# Patient Record
Sex: Male | Born: 1980 | Race: White | Hispanic: No | Marital: Single | Smoking: Never smoker
Health system: Southern US, Community
[De-identification: ages and names within clinical notes are randomized; demographics above are authoritative.]

---

## 2008-09-09 ENCOUNTER — Emergency Department (HOSPITAL_BASED_OUTPATIENT_CLINIC_OR_DEPARTMENT_OTHER): Admission: EM | Admit: 2008-09-09 | Discharge: 2008-09-09 | Payer: Self-pay | Admitting: Emergency Medicine

## 2008-09-27 ENCOUNTER — Emergency Department (HOSPITAL_BASED_OUTPATIENT_CLINIC_OR_DEPARTMENT_OTHER): Admission: EM | Admit: 2008-09-27 | Discharge: 2008-09-27 | Payer: Self-pay | Admitting: Emergency Medicine

## 2008-09-29 ENCOUNTER — Ambulatory Visit (HOSPITAL_COMMUNITY): Admission: RE | Admit: 2008-09-29 | Discharge: 2008-09-29 | Payer: Self-pay | Admitting: Family Medicine

## 2008-11-02 ENCOUNTER — Ambulatory Visit (HOSPITAL_COMMUNITY): Admission: RE | Admit: 2008-11-02 | Discharge: 2008-11-03 | Payer: Self-pay | Admitting: Neurosurgery

## 2010-09-04 ENCOUNTER — Encounter: Payer: Self-pay | Admitting: Family Medicine

## 2010-11-24 LAB — DIFFERENTIAL
Basophils Relative: 1 % (ref 0–1)
Eosinophils Absolute: 0.3 10*3/uL (ref 0.0–0.7)
Eosinophils Relative: 4 % (ref 0–5)
Monocytes Absolute: 0.7 10*3/uL (ref 0.1–1.0)
Monocytes Relative: 10 % (ref 3–12)

## 2010-11-24 LAB — CBC
HCT: 43 % (ref 39.0–52.0)
Hemoglobin: 15.1 g/dL (ref 13.0–17.0)
MCHC: 35.2 g/dL (ref 30.0–36.0)
MCV: 90.5 fL (ref 78.0–100.0)
RBC: 4.75 MIL/uL (ref 4.22–5.81)
RDW: 12 % (ref 11.5–15.5)

## 2010-11-24 LAB — TYPE AND SCREEN

## 2010-11-28 LAB — COMPREHENSIVE METABOLIC PANEL
AST: 31 U/L (ref 0–37)
CO2: 28 mEq/L (ref 19–32)
Chloride: 104 mEq/L (ref 96–112)
Creatinine, Ser: 1.2 mg/dL (ref 0.4–1.5)
GFR calc Af Amer: >60 mL/min (ref >60)
GFR calc non Af Amer: >60 mL/min (ref >60)
Glucose, Bld: 89 mg/dL (ref 70–99)
Total Bilirubin: 0.5 mg/dL (ref 0.3–1.2)

## 2010-11-28 LAB — DIFFERENTIAL
Basophils Absolute: 0.1 10*3/uL (ref 0.0–0.1)
Eosinophils Absolute: 0.2 10*3/uL (ref 0.0–0.7)
Eosinophils Relative: 2 % (ref 0–5)
Lymphocytes Relative: 24 % (ref 12–46)

## 2010-11-28 LAB — CBC
HCT: 48.1 % (ref 39.0–52.0)
Hemoglobin: 16.3 g/dL (ref 13.0–17.0)
MCV: 91.6 fL (ref 78.0–100.0)
RBC: 5.25 MIL/uL (ref 4.22–5.81)
WBC: 7.9 10*3/uL (ref 4.0–10.5)

## 2010-12-27 NOTE — Op Note (Signed)
NAMEALCUS, BRADLY               ACCOUNT NO.:  [id number]   MEDICAL RECORD NO.:  [id number]          PATIENT TYPE:  OIB   LOCATION:  3528                         FACILITY:  [facility]   PHYSICIAN:  Henry A. Pool, M.D.    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   PROCEDURE:  Right L5/transitional level laminotomy and microdiskectomy.   SURGEON:  Kathaleen Maser. Pool, MD   ASSISTANT:  Donalee Citrin, MD   ANESTHESIA:  General endotracheal.   INDICATIONS:  Mr. Curtis Molina is a 30 year old male with history of back and  right lower extremity pain failing conservative management.  Workup  demonstrates evidence of a enlarged right paracentral disk herniation in  his lumbosacral junction.  The patient has evidence for what can best be  described as transitional anatomy.  The disk spaces at C-L5 slash  transitional level paracentrally on the right.  We have discussed  options available for management including the possibility of moving  forward with a laminotomy and microdiskectomy and hope curing his  symptoms.   OPERATIVE PROC DURE:  The patient was brought to operating room, placed  on the operating table the supine position.  After an adequate level of  anesthesia was achieved, the patient was placed prone onto Wilson frame.  Appropriately padded the patient's lumbar region, prepped and sterilely.  A 10 blade was used to make a curvilinear skin incision, overlying his  lumbosacral junction.  This was carried down sharply in midline.  Subperiosteal dissection was then performed exposing the lamina and  facet joints L5 and transitional level on the right side.  Deep self-  retaining retractor was placed.  Intraoperative x-rays  were taken and  level was confirmed.  Laminotomy was then performed using high-speed  drill and Kerrison rongeurs to remove the inferior aspect of the lamina  of L5, medial aspect of L5, transitional level, facet joint complex, and  the superior aspect of the transitional level lamina.  Ligament flavum  was then elevated and resected in piecemeal fashion using Kerrison  rongeurs.  Underlying thecal sac and exiting S1 nerve root were  identified.  Microscope was then brought to the field using  microdissection of the right side S1 nerve root underlying disk  herniation.  Epidural venous plexus coagulating cut.  Thecal sac and  nerve root were then gently mobilized, tracked towards midline.  Disk  herniation was readily apparent.  This was then incised with 15 blade in  a rectangular fashion.  Wide disk space clean-out was achieved using  pituitary rongeurs, upbiting pituitary rongeurs, and Epstein curettes.  Almost the disk herniation completely resected.  All loose or obviously  degenerative disk material was then removed the interspace.  At this  point, a very thorough diskectomy was achieved.  There is no evidence of  injury to thecal sac and nerve roots.  Wound was then  irrigated with  antibiotic solution.  Gelfoam was placed topically.  Hemostasis was  found to be good.  Microscope and Kerrisons  were removed.  Hemostasis once assured, the surgical wound was then  closed in layers with Vicryl sutures.  Steri-Strips, sterile dressings  were applied.  There are no complications.  He tolerated the procedure  well and he returns to recovery room postoperatively.           ______________________________  Kathaleen Maser Pool, M.D.     HAP/MEDQ  D:  11/02/2008  T:  11/03/2008  Job:  [id number]

## 2019-01-21 ENCOUNTER — Ambulatory Visit: Payer: Self-pay | Admitting: Registered Nurse

## 2019-04-25 ENCOUNTER — Encounter (HOSPITAL_COMMUNITY): Payer: Self-pay | Admitting: Emergency Medicine

## 2019-04-25 ENCOUNTER — Emergency Department (HOSPITAL_COMMUNITY)
Admission: EM | Admit: 2019-04-25 | Discharge: 2019-04-25 | Disposition: A | Discharge status: Home / Self Care | Payer: Self-pay | Attending: Emergency Medicine | Admitting: Emergency Medicine

## 2019-04-25 ENCOUNTER — Emergency Department (HOSPITAL_COMMUNITY): Payer: Self-pay

## 2019-04-25 ENCOUNTER — Other Ambulatory Visit: Payer: Self-pay

## 2019-04-25 DIAGNOSIS — M25561 Pain in right knee: Secondary | ICD-10-CM | POA: Insufficient documentation

## 2019-04-25 MED ORDER — KETOROLAC TROMETHAMINE 30 MG/ML IJ SOLN
30.0000 mg | Freq: Once | INTRAMUSCULAR | Status: DC
  Filled 2019-04-25: qty 1

## 2019-04-25 MED ORDER — NAPROXEN 375 MG PO TABS: 375.0000 mg | ORAL_TABLET | Freq: Two times a day (BID) | ORAL | 0 refills | Status: AC

## 2019-04-25 NOTE — Progress Notes (Signed)
Orthopedic Tech Progress Note Patient Details:  Curtis Molina 1980-12-06 [id number]  Ortho Devices Type of Ortho Device: Crutches, Knee Immobilizer Ortho Device/Splint Location: LRE Ortho Device/Splint Interventions: Adjustment, Application, Ordered   Post Interventions Patient Tolerated: Ambulated well Instructions Provided: Poper ambulation with device, Care of device, Adjustment of device   Janit Pagan 04/25/2019, 1:42 PM

## 2019-04-25 NOTE — Discharge Instructions (Addendum)
Call Dr. Luanna Cole office for an appointment.

## 2019-04-25 NOTE — ED Notes (Signed)
Ortho tech at pt bedside 

## 2019-04-25 NOTE — ED Triage Notes (Signed)
Rt knee pain sincve Monday and it hurts to walk and move , denies any I recent njury , hurt it 2 years ago

## 2019-04-25 NOTE — ED Notes (Signed)
Patient verbalizes understanding of discharge instructions. Opportunity for questioning and answers were provided. Armband removed by staff, pt discharged from ED.  

## 2019-04-25 NOTE — ED Provider Notes (Signed)
[facility]   CSN: [id number] Arrival date & time: 04/25/19  1054     History   Chief Complaint Chief Complaint  Patient presents with  . Knee Pain    HPI Gloriajean DellLucas H Stonehouse is a 38 y.o. male who presents to the ED with c/o right knee pain. Patient reports hx of injury to same knee a few years ago and had a torn meniscus. Patient does a lot of walking at work. This pain started about a week ago. Patent reports took ibuprofen at first but now not taking anything. Patient wearing an elastic knee brace. Patient denies any other symptoms.    The history is provided by the patient.  Knee Pain Location:  Knee Time since incident:  1 week Knee location:  R knee Pain details:    Quality:  Throbbing and shooting   Radiates to:  Does not radiate   Severity:  Severe   Onset quality:  Gradual   Timing:  Constant   Progression:  Worsening Chronicity:  Recurrent Dislocation: no   Foreign body present:  No foreign bodies Prior injury to area:  Yes Associated symptoms: decreased ROM (due to pain)     History reviewed. No pertinent past medical history.  There are no active problems to display for this patient.   History reviewed. No pertinent surgical history.      Home Medications    Prior to Admission medications   Medication Sig Start Date End Date Taking? Authorizing Provider  naproxen (NAPROSYN) 375 MG tablet Take 1 tablet (375 mg total) by mouth 2 (two) times daily. 04/25/19   Janne NapoleonNeese, Hope M, NP    Family History No family history on file.  Social History Social History   Tobacco Use  . Smoking status: Never Smoker  . Smokeless tobacco: Never Used  Substance Use Topics  . Alcohol use: Not on file  . Drug use: Not on file     Allergies   Patient has no known allergies.   Review of Systems Review of Systems  Constitutional: Negative for appetite change.  HENT: Negative.   Musculoskeletal: Positive for  arthralgias.  Skin: Negative for rash and wound.     Physical Exam Updated Vital Signs BP 93/79 (BP Location: Left Arm)   Pulse 74   Temp 98.5 F (36.9 C) (Oral)   Resp 17   SpO2 97%   Physical Exam Vitals signs and nursing note reviewed.  Constitutional:      Appearance: He is well-developed.  HENT:     Nose: No congestion.  Eyes:     Conjunctiva/sclera: Conjunctivae normal.  Neck:     Musculoskeletal: Neck supple.  Cardiovascular:     Rate and Rhythm: Normal rate.  Pulmonary:     Effort: Pulmonary effort is normal.  Musculoskeletal:     Right knee: He exhibits deformity. He exhibits normal range of motion, no swelling, no effusion, no ecchymosis, no laceration, no erythema, normal alignment and normal patellar mobility. Tenderness found. MCL and patellar tendon tenderness noted.     Comments: Pedal pulses 2+, adequate circulation. No calf tenderness.  Skin:    General: Skin is warm and dry.  Neurological:     Mental Status: He is alert and oriented to person, place, and time.  Psychiatric:        Mood and Affect: Mood normal.      ED Treatments / Results  Labs (all labs ordered are listed, but only abnormal  results are displayed) Labs Reviewed - No data to display  EKG None  Radiology Dg Knee Complete 4 Views Right  Result Date: 04/25/2019 CLINICAL DATA:  Pain when moving since Monday.  No recent trauma. EXAM: RIGHT KNEE - COMPLETE 4+ VIEW COMPARISON:  None. FINDINGS: Mild clothing artifact degradation. No acute fracture or dislocation. No joint effusion. Joint spaces maintained. IMPRESSION: No acute osseous abnormality. Electronically Signed   By: Abigail Miyamoto M.D.   On: 04/25/2019 12:37    Procedures Procedures (including critical care time)  Medications Ordered in ED Medications  ketorolac (TORADOL) 30 MG/ML injection 30 mg (has no administration in time range)     Initial Impression / Assessment and Plan / ED Course  I have reviewed the triage  vital signs and the nursing notes. 38 y.o. male with recurrent right knee pain stable for d/c without fracture or dislocation noted on x-ray. Referral to ortho. Discussed plan and answered patient questions. Knee immobilizer and crutches.  Pertinent labs & imaging results that were available during my care of the patient were reviewed by me and considered in my medical decision making (see chart for details).   Final Clinical Impressions(s) / ED Diagnoses   Final diagnoses:  Right knee pain, unspecified chronicity    ED Discharge Orders         Ordered    naproxen (NAPROSYN) 375 MG tablet  2 times daily     04/25/19 1315           Janit Bern [place], [place] 04/25/19 1318    Charlesetta Shanks, MD 04/28/19 1410

## 2020-05-08 IMAGING — CR DG KNEE COMPLETE 4+V*R*
4 series · 4 of 4 positions shown · non-contrast
Comparison: None.

CLINICAL DATA: Pain when moving since [REDACTED].  No recent trauma.

EXAM:
RIGHT KNEE - COMPLETE 4+ VIEW

[knee ap]
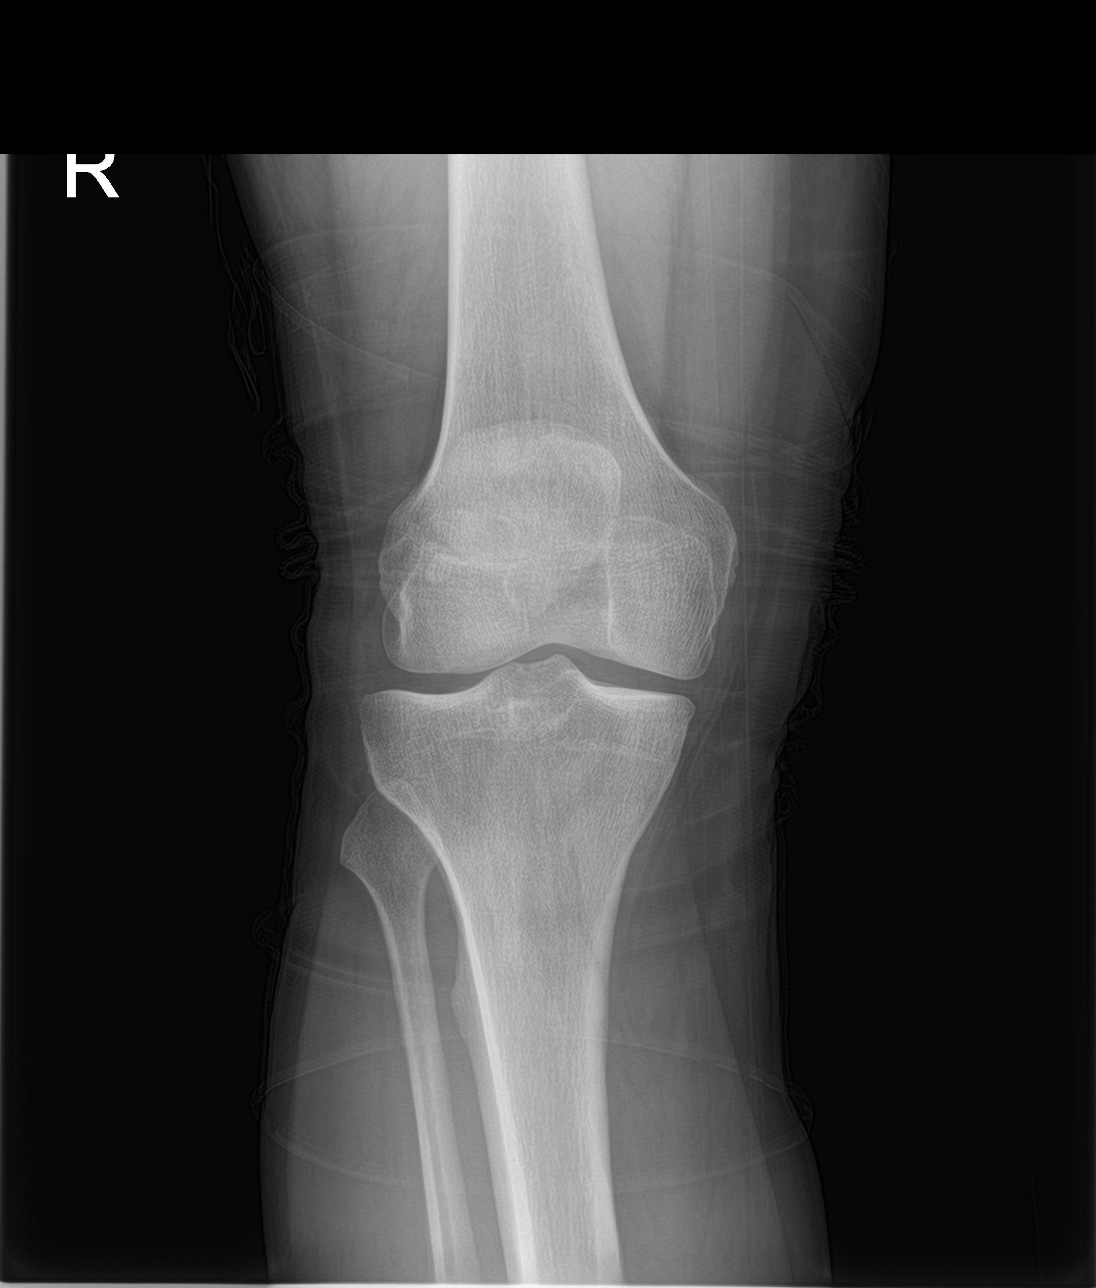

[knee lat]
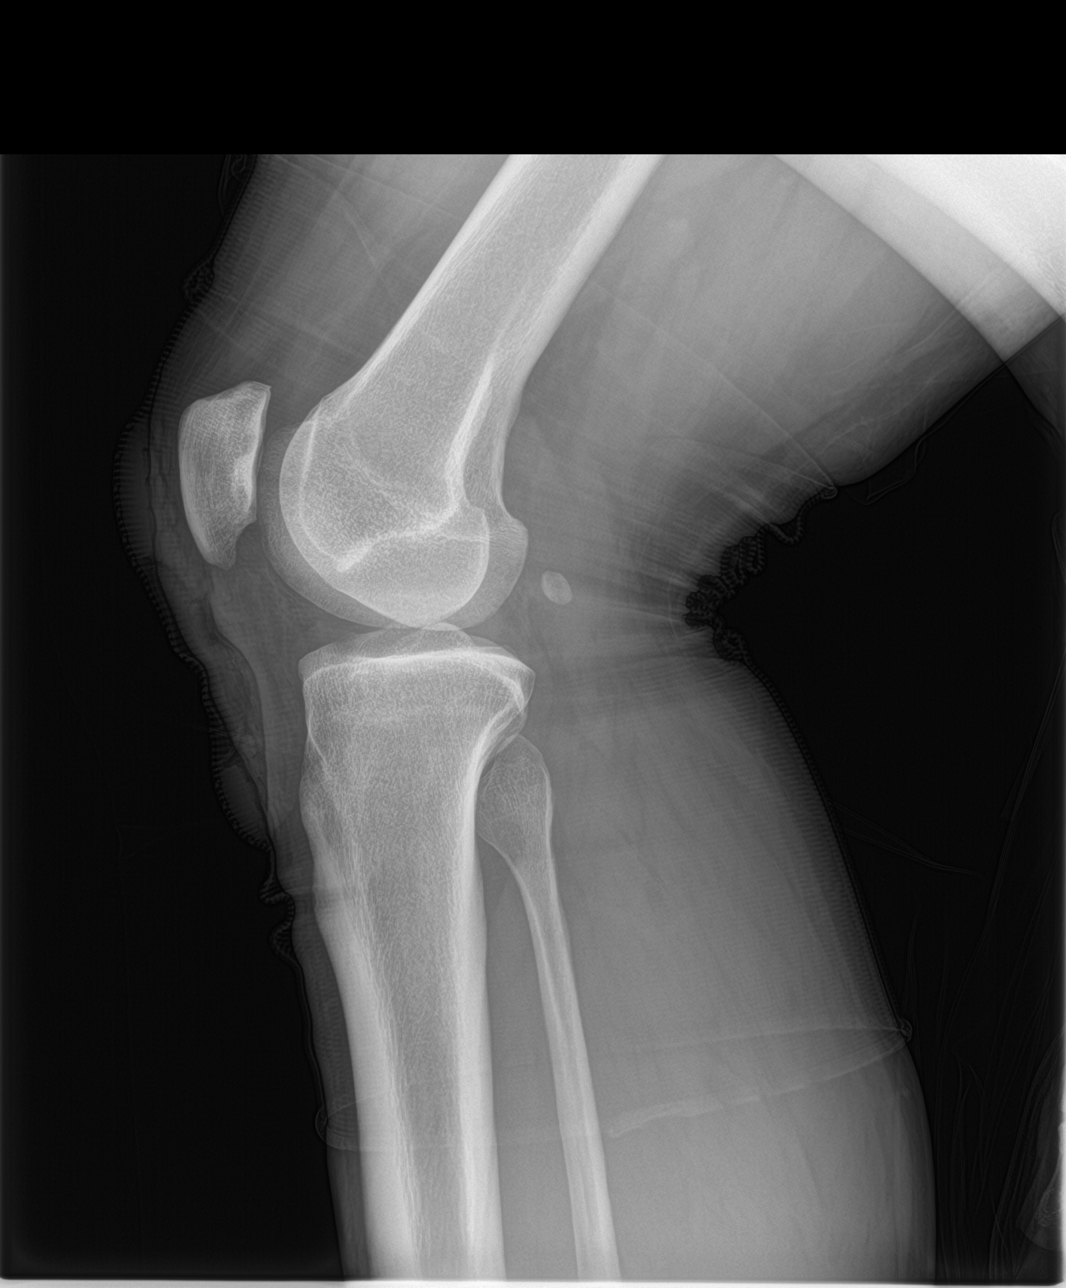

[knee obl (1 of 2)]
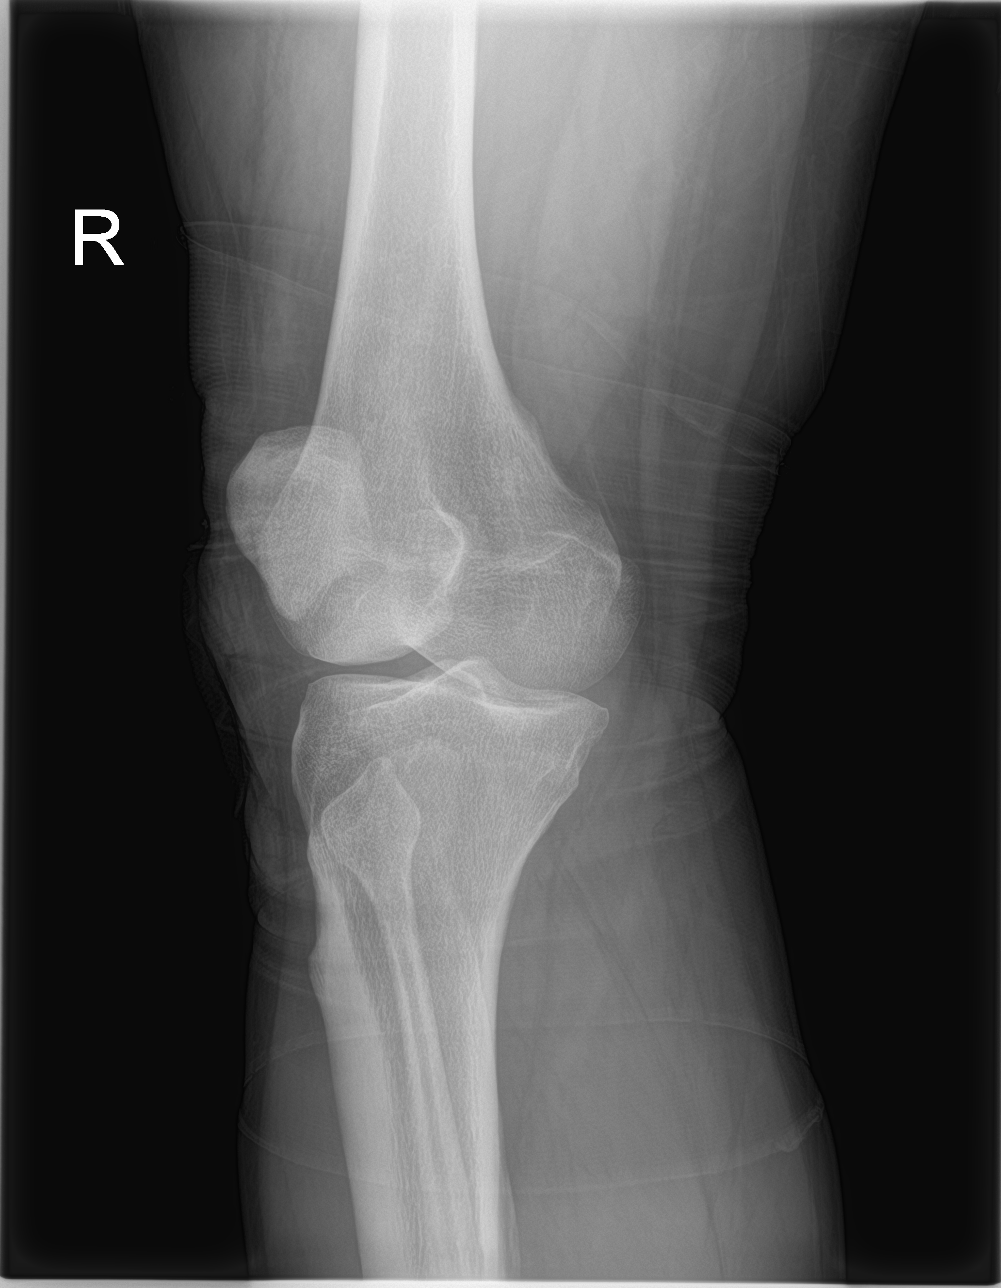

[knee obl (2 of 2)]
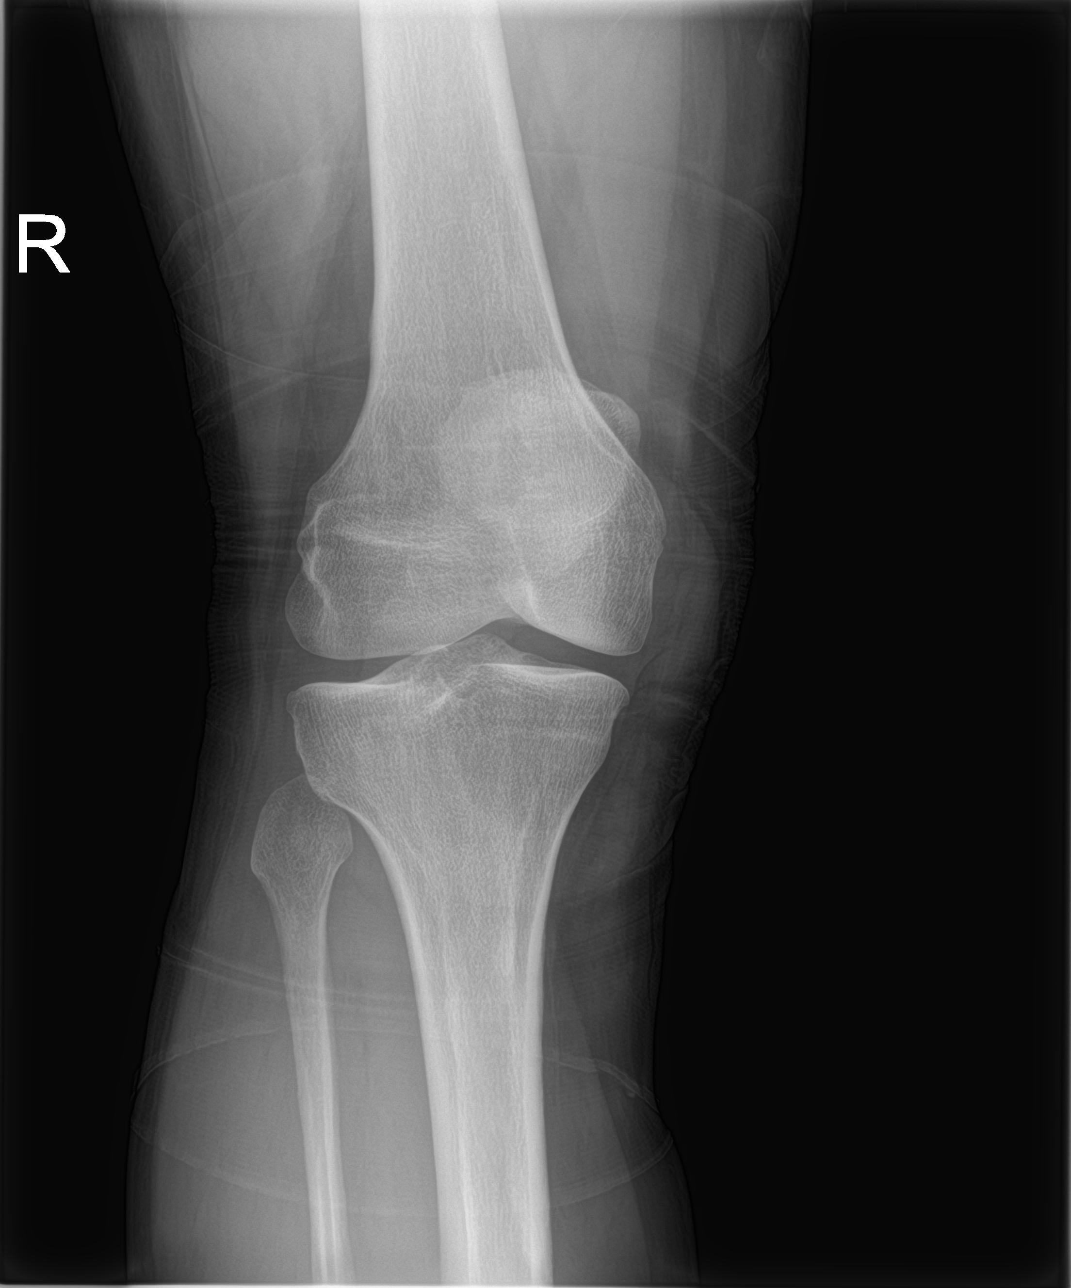

[4 of 4 positions shown; findings below may reference images not displayed]

FINDINGS: Mild clothing artifact degradation. No acute fracture or
dislocation. No joint effusion. Joint spaces maintained.
IMPRESSION: No acute osseous abnormality.
